# Patient Record
Sex: Female | Born: 1995 | Hispanic: No | Marital: Single | Smoking: Never smoker
Health system: Southern US, Community
[De-identification: ages and names within clinical notes are randomized; demographics above are authoritative.]

---

## 2015-10-21 ENCOUNTER — Encounter (HOSPITAL_COMMUNITY): Payer: Self-pay

## 2015-10-21 ENCOUNTER — Emergency Department (HOSPITAL_COMMUNITY)
Admission: EM | Admit: 2015-10-21 | Discharge: 2015-10-22 | Disposition: A | Discharge status: Home / Self Care | Payer: PRIVATE HEALTH INSURANCE | Attending: Emergency Medicine | Admitting: Emergency Medicine

## 2015-10-21 DIAGNOSIS — K529 Noninfective gastroenteritis and colitis, unspecified: Secondary | ICD-10-CM

## 2015-10-21 DIAGNOSIS — E860 Dehydration: Secondary | ICD-10-CM

## 2015-10-21 DIAGNOSIS — R05 Cough: Secondary | ICD-10-CM | POA: Diagnosis not present

## 2015-10-21 DIAGNOSIS — R112 Nausea with vomiting, unspecified: Secondary | ICD-10-CM | POA: Diagnosis present

## 2015-10-21 DIAGNOSIS — Z3202 Encounter for pregnancy test, result negative: Secondary | ICD-10-CM | POA: Insufficient documentation

## 2015-10-21 DIAGNOSIS — R000 Tachycardia, unspecified: Secondary | ICD-10-CM | POA: Diagnosis not present

## 2015-10-21 DIAGNOSIS — R0981 Nasal congestion: Secondary | ICD-10-CM | POA: Insufficient documentation

## 2015-10-21 LAB — CBC
HCT: 45 % (ref 36.0–46.0)
HEMOGLOBIN: 15 g/dL (ref 12.0–15.0)
MCH: 28.4 pg (ref 26.0–34.0)
MCHC: 33.3 g/dL (ref 30.0–36.0)
MCV: 85.1 fL (ref 78.0–100.0)
PLATELETS: 251 10*3/uL (ref 150–400)
RBC: 5.29 MIL/uL — AB (ref 3.87–5.11)
RDW: 13.2 % (ref 11.5–15.5)
WBC: 14.5 10*3/uL — AB (ref 4.0–10.5)

## 2015-10-21 LAB — POC URINE PREG, ED: PREG TEST UR: NEGATIVE

## 2015-10-21 NOTE — ED Provider Notes (Signed)
CSN: [id number]     Arrival date & time 10/21/15  2256 History  By signing my name below, I, Doreatha MartinEva Mathews, attest that this documentation has been prepared under the direction and in the presence of Derwood KaplanAnkit Hadlei Stitt, MD. Electronically Signed: Doreatha MartinEva Mathews, ED Scribe. 10/22/2015. 12:29 AM.    Chief Complaint  Patient presents with  . Emesis   The history is provided by the patient. No language interpreter was used.    HPI Comments: [place] CalixChristine Krager is a 19 y.o. female who presents to the Emergency Department complaining of moderate, intermittent, sudden onset emesis (10x, last episode one hour ago) onset at 1900 this evening. She states associated nausea, abdominal pain, loose and watery diarrhea (10x), productive cough with blood streaking in the phlegm, nasal congestion, transient dizziness and lightheadedness (resolved). She reports that her emesis began before her abdominal pain. She also notes some abdominal distension before the emesis that has resolved. No blood or mucous noted in her emesis or diarrhea. She states that her emesis had stomach content initially followed by yellow and green content. Pt reports relief of cough with Nyquil. No family members with similar symptoms. No suspicious food intake. Otherwise healthy. No h/o abdominal surgery. She denies fever, myalgias, sore throat, wheezing.   History reviewed. No pertinent past medical history. History reviewed. No pertinent past surgical history. No family history on file. Social History  Substance Use Topics  . Smoking status: Never Smoker   . Smokeless tobacco: None  . Alcohol Use: Yes   OB History    No data available     Review of Systems  Constitutional: Negative for fever.  HENT: Positive for congestion. Negative for rhinorrhea and sore throat.   Respiratory: Positive for cough. Negative for wheezing.   Gastrointestinal: Positive for nausea, vomiting, abdominal pain and diarrhea. Negative for blood in stool.   Musculoskeletal: Negative for myalgias.  Neurological: Negative for dizziness and light-headedness.   A complete 10 system review of systems was obtained and all systems are negative except as noted in the HPI and PMH.   Allergies  Review of patient's allergies indicates no known allergies.  Home Medications   Prior to Admission medications   Medication Sig Start Date End Date Taking? Authorizing Provider  benzonatate (TESSALON) 100 MG capsule Take 1 capsule (100 mg total) by mouth every 8 (eight) hours. 10/22/15   Derwood KaplanAnkit Maximillian Habibi, MD  ondansetron (ZOFRAN ODT) 8 MG disintegrating tablet Take 1 tablet (8 mg total) by mouth every 8 (eight) hours as needed for nausea. 10/22/15   Avea Mcgowen Rhunette CroftNanavati, MD   BP 92/64 mmHg  Pulse 100  Temp(Src) 97.9 F (36.6 C) (Oral)  Resp 18  Ht 5' (1.524 m)  Wt 106 lb (48.081 kg)  BMI 20.70 kg/m2  SpO2 99%  LMP 09/16/2015 Physical Exam  Constitutional: She is oriented to person, place, and time. She appears well-developed and well-nourished.  HENT:  Head: Normocephalic and atraumatic.  Mouth/Throat: Oropharynx is clear and moist. No oropharyngeal exudate.  Eyes: Conjunctivae and EOM are normal. Pupils are equal, round, and reactive to light.  Neck: Normal range of motion. Neck supple.  Cardiovascular: Tachycardia present.   No murmur heard. Pulmonary/Chest: Effort normal. No respiratory distress.  Lungs CTA bilaterally.   Abdominal: Soft. She exhibits no distension. There is no tenderness.  Musculoskeletal: Normal range of motion.  Lymphadenopathy:    She has no cervical adenopathy.  Neurological: She is alert and oriented to person, place, and time.  Skin: Skin is warm  and dry.  Psychiatric: She has a normal mood and affect. Her behavior is normal.  Nursing note and vitals reviewed.  ED Course  Procedures (including critical care time) DIAGNOSTIC STUDIES: Oxygen Saturation is 97% on RA, normal by my interpretation.    COORDINATION OF  CARE: 12:26 AM Discussed treatment plan with pt at bedside and pt agreed to plan.  1:40 AM Pt reassessed; reports she still feels nauseated. She states she has not had any emesis since examination. Discussed strict return precautions. Will discharge with nausea and cough medication.    Labs Review Labs Reviewed  COMPREHENSIVE METABOLIC PANEL - Abnormal; Notable for the following:    Glucose, Bld 113 (*)    All other components within normal limits  CBC - Abnormal; Notable for the following:    WBC 14.5 (*)    RBC 5.29 (*)    All other components within normal limits  URINALYSIS, ROUTINE W REFLEX MICROSCOPIC (NOT AT [facility]) - Abnormal; Notable for the following:    APPearance CLOUDY (*)    Bilirubin Urine SMALL (*)    Ketones, ur >80 (*)    Protein, ur 30 (*)    Leukocytes, UA SMALL (*)    All other components within normal limits  URINE MICROSCOPIC-ADD ON - Abnormal; Notable for the following:    Squamous Epithelial / LPF 6-30 (*)    Bacteria, UA RARE (*)    All other components within normal limits  LIPASE, BLOOD  POC URINE PREG, ED   I have personally reviewed and evaluated these lab results as part of my medical decision-making.  MDM   Final diagnoses:  Acute gastroenteritis  Dehydration    I personally performed the services described in this documentation, which was scribed in my presence. The recorded information has been reviewed and is accurate.   Pt comes in with c of n/v/diarrhea. She is tachycardic. Abs exam is normal. Pts symptoma have resolved since ER arrival. Labs show ketonuria. Likely mild dehydration. Will get po challenge initiated-  If she passes, we will d/c with return precautions.   Derwood Kaplan, MD 10/22/15 [phone]

## 2015-10-21 NOTE — ED Notes (Signed)
Pt here with c/o emesis x 10, diarrhea "a lot," onset 1900 tonight. Pt denies abdominal pain. Pt also reports productive cough.

## 2015-10-22 LAB — COMPREHENSIVE METABOLIC PANEL
ALK PHOS: 77 U/L (ref 38–126)
ALT: 20 U/L (ref 14–54)
ANION GAP: 11 (ref 5–15)
AST: 24 U/L (ref 15–41)
Albumin: 4.4 g/dL (ref 3.5–5.0)
BILIRUBIN TOTAL: 0.7 mg/dL (ref 0.3–1.2)
BUN: 14 mg/dL (ref 6–20)
CALCIUM: 9.1 mg/dL (ref 8.9–10.3)
CO2: 23 mmol/L (ref 22–32)
CREATININE: 0.74 mg/dL (ref 0.44–1.00)
Chloride: 102 mmol/L (ref 101–111)
Glucose, Bld: 113 mg/dL — ABNORMAL HIGH (ref 65–99)
Potassium: 4.2 mmol/L (ref 3.5–5.1)
Sodium: 136 mmol/L (ref 135–145)
TOTAL PROTEIN: 7.8 g/dL (ref 6.5–8.1)

## 2015-10-22 LAB — URINALYSIS, ROUTINE W REFLEX MICROSCOPIC
Glucose, UA: NEGATIVE mg/dL
Hgb urine dipstick: NEGATIVE
Ketones, ur: >80 mg/dL — AB
NITRITE: NEGATIVE
Protein, ur: 30 mg/dL — AB
SPECIFIC GRAVITY, URINE: 1.029 (ref 1.005–1.030)
pH: 7 (ref 5.0–8.0)

## 2015-10-22 LAB — LIPASE, BLOOD: Lipase: 28 U/L (ref 11–51)

## 2015-10-22 LAB — URINE MICROSCOPIC-ADD ON: RBC / HPF: NONE SEEN RBC/hpf (ref 0–5)

## 2015-10-22 MED ORDER — ONDANSETRON 4 MG PO TBDP
8.0000 mg | ORAL_TABLET | Freq: Once | ORAL | Status: AC
  Administered 2015-10-22: 8 mg via ORAL
  Filled 2015-10-22: qty 2

## 2015-10-22 MED ORDER — PROMETHAZINE HCL 25 MG PO TABS
25.0000 mg | ORAL_TABLET | Freq: Once | ORAL | Status: AC
  Administered 2015-10-22: 25 mg via ORAL
  Filled 2015-10-22: qty 1

## 2015-10-22 MED ORDER — BENZONATATE 100 MG PO CAPS: 100.0000 mg | ORAL_CAPSULE | Freq: Three times a day (TID) | ORAL | Status: AC

## 2015-10-22 MED ORDER — ONDANSETRON 8 MG PO TBDP: 8.0000 mg | ORAL_TABLET | Freq: Three times a day (TID) | ORAL | Status: AC | PRN

## 2015-10-22 MED ORDER — BENZONATATE 100 MG PO CAPS
100.0000 mg | ORAL_CAPSULE | Freq: Once | ORAL | Status: AC
  Administered 2015-10-22: 100 mg via ORAL
  Filled 2015-10-22: qty 1

## 2015-10-22 NOTE — Discharge Instructions (Signed)
We saw you in the ER for the vomiting and the loose bowel movement. All the results in the ER are normal -except slightly elevated white count (marker for stress) and sign of dehydration on the urine test. We think you might have mild stomach flu.  Please return to the ER if your symptoms worsen; you have increased pain, fevers, chills, inability to keep any medications down, bloody stools. Ensure you drink plenty of fluids.  Clear Liquid Diet A clear liquid diet is a short-term diet that is prescribed to provide the necessary fluid and basic energy you need when you can have nothing else. The clear liquid diet consists of liquids or solids that will become liquid at room temperature. You should be able to see through the liquid. There are many reasons that you may be restricted to clear liquids, such as:  When you have a sudden-onset (acute) condition that occurs before or after surgery.  To help your body slowly get adjusted to food again after a long period when you were unable to have food.  Replacement of fluids when you have a diarrheal disease.  When you are going to have certain exams, such as a colonoscopy, in which instruments are inserted inside your body to look at parts of your digestive system. WHAT CAN I HAVE? A clear liquid diet does not provide all the nutrients you need. It is important to choose a variety of the following items to get as many nutrients as possible:  Vegetable juices that do not have pulp.  Fruit juices and fruit drinks that do not have pulp.  Coffee (regular or decaffeinated), tea, or soda at the discretion of your health care provider.  Clear bouillon, broth, or strained broth-based soups.  High-protein and flavored gelatins.  Sugar or honey.  Ices or frozen ice pops that do not contain milk. If you are not sure whether you can have certain items, you should ask your health care provider. You may also ask your health care provider if there are any  other clear liquid options.   This information is not intended to replace advice given to you by your health care provider. Make sure you discuss any questions you have with your health care provider.   Document Released: 11/13/2005 Document Revised: 11/18/2013 Document Reviewed: 10/10/2013 Elsevier Interactive Patient Education 2016 [employer] Viral Gastroenteritis Viral gastroenteritis is also known as stomach flu. This condition affects the stomach and intestinal tract. It can cause sudden diarrhea and vomiting. The illness typically lasts 3 to 8 days. Most people develop an immune response that eventually gets rid of the virus. While this natural response develops, the virus can make you quite ill. CAUSES  Many different viruses can cause gastroenteritis, such as rotavirus or noroviruses. You can catch one of these viruses by consuming contaminated food or water. You may also catch a virus by sharing utensils or other personal items with an infected person or by touching a contaminated surface. SYMPTOMS  The most common symptoms are diarrhea and vomiting. These problems can cause a severe loss of body fluids (dehydration) and a body salt (electrolyte) imbalance. Other symptoms may include:  Fever.  Headache.  Fatigue.  Abdominal pain. DIAGNOSIS  Your caregiver can usually diagnose viral gastroenteritis based on your symptoms and a physical exam. A stool sample may also be taken to test for the presence of viruses or other infections. TREATMENT  This illness typically goes away on its own. Treatments are aimed at rehydration. The most  serious cases of viral gastroenteritis involve vomiting so severely that you are not able to keep fluids down. In these cases, fluids must be given through an intravenous line (IV). HOME CARE INSTRUCTIONS   Drink enough fluids to keep your urine clear or pale yellow. Drink small amounts of fluids frequently and increase the amounts as tolerated.  Ask  your caregiver for specific rehydration instructions.  Avoid:  Foods high in sugar.  Alcohol.  Carbonated drinks.  Tobacco.  Juice.  Caffeine drinks.  Extremely hot or cold fluids.  Fatty, greasy foods.  Too much intake of anything at one time.  Dairy products until 24 to 48 hours after diarrhea stops.  You may consume probiotics. Probiotics are active cultures of beneficial bacteria. They may lessen the amount and number of diarrheal stools in adults. Probiotics can be found in yogurt with active cultures and in supplements.  Wash your hands well to avoid spreading the virus.  Only take over-the-counter or prescription medicines for pain, discomfort, or fever as directed by your caregiver. Do not give aspirin to children. Antidiarrheal medicines are not recommended.  Ask your caregiver if you should continue to take your regular prescribed and over-the-counter medicines.  Keep all follow-up appointments as directed by your caregiver. SEEK IMMEDIATE MEDICAL CARE IF:   You are unable to keep fluids down.  You do not urinate at least once every 6 to 8 hours.  You develop shortness of breath.  You notice blood in your stool or vomit. This may look like coffee grounds.  You have abdominal pain that increases or is concentrated in one small area (localized).  You have persistent vomiting or diarrhea.  You have a fever.  The patient is a child younger than 3 months, and he or she has a fever.  The patient is a child older than 3 months, and he or she has a fever and persistent symptoms.  The patient is a child older than 3 months, and he or she has a fever and symptoms suddenly get worse.  The patient is a baby, and he or she has no tears when crying. MAKE SURE YOU:   Understand these instructions.  Will watch your condition.  Will get help right away if you are not doing well or get worse.   This information is not intended to replace advice given to you by  your health care provider. Make sure you discuss any questions you have with your health care provider.   Document Released: 11/13/2005 Document Revised: 02/05/2012 Document Reviewed: 08/30/2011 Elsevier Interactive Patient Education [employer]

## 2018-07-19 ENCOUNTER — Encounter (HOSPITAL_COMMUNITY): Payer: Self-pay

## 2018-07-19 ENCOUNTER — Emergency Department (HOSPITAL_COMMUNITY)
Admission: EM | Admit: 2018-07-19 | Discharge: 2018-07-19 | Disposition: A | Discharge status: Home / Self Care | Payer: No Typology Code available for payment source | Attending: Emergency Medicine | Admitting: Emergency Medicine

## 2018-07-19 ENCOUNTER — Emergency Department (HOSPITAL_COMMUNITY): Payer: No Typology Code available for payment source

## 2018-07-19 DIAGNOSIS — N83201 Unspecified ovarian cyst, right side: Secondary | ICD-10-CM | POA: Insufficient documentation

## 2018-07-19 DIAGNOSIS — R102 Pelvic and perineal pain: Secondary | ICD-10-CM | POA: Diagnosis present

## 2018-07-19 DIAGNOSIS — A749 Chlamydial infection, unspecified: Secondary | ICD-10-CM | POA: Diagnosis not present

## 2018-07-19 DIAGNOSIS — R079 Chest pain, unspecified: Secondary | ICD-10-CM | POA: Insufficient documentation

## 2018-07-19 LAB — CBC WITH DIFFERENTIAL/PLATELET
BASOS PCT: 0 %
Basophils Absolute: 0 10*3/uL (ref 0.0–0.1)
EOS ABS: 0.1 10*3/uL (ref 0.0–0.7)
Eosinophils Relative: 1 %
HEMATOCRIT: 44 % (ref 36.0–46.0)
HEMOGLOBIN: 13.8 g/dL (ref 12.0–15.0)
LYMPHS PCT: 42 %
Lymphs Abs: 2.8 10*3/uL (ref 0.7–4.0)
MCH: 27.8 pg (ref 26.0–34.0)
MCHC: 31.4 g/dL (ref 30.0–36.0)
MCV: 88.7 fL (ref 78.0–100.0)
Monocytes Absolute: 0.3 10*3/uL (ref 0.1–1.0)
Monocytes Relative: 5 %
NEUTROS ABS: 3.5 10*3/uL (ref 1.7–7.7)
NEUTROS PCT: 52 %
Platelets: 212 10*3/uL (ref 150–400)
RBC: 4.96 MIL/uL (ref 3.87–5.11)
RDW: 12.5 % (ref 11.5–15.5)
WBC: 6.7 10*3/uL (ref 4.0–10.5)

## 2018-07-19 LAB — COMPREHENSIVE METABOLIC PANEL
ALK PHOS: 83 U/L (ref 38–126)
ALT: 16 U/L (ref 0–44)
ANION GAP: 7 (ref 5–15)
AST: 29 U/L (ref 15–41)
Albumin: 4.3 g/dL (ref 3.5–5.0)
BILIRUBIN TOTAL: 0.9 mg/dL (ref 0.3–1.2)
BUN: 7 mg/dL (ref 6–20)
CALCIUM: 9.2 mg/dL (ref 8.9–10.3)
CO2: 26 mmol/L (ref 22–32)
CREATININE: 0.77 mg/dL (ref 0.44–1.00)
Chloride: 107 mmol/L (ref 98–111)
Glucose, Bld: 93 mg/dL (ref 70–99)
Potassium: 4.7 mmol/L (ref 3.5–5.1)
SODIUM: 140 mmol/L (ref 135–145)
Total Protein: 6.6 g/dL (ref 6.5–8.1)

## 2018-07-19 LAB — URINALYSIS, ROUTINE W REFLEX MICROSCOPIC
Bilirubin Urine: NEGATIVE
Glucose, UA: NEGATIVE mg/dL
HGB URINE DIPSTICK: NEGATIVE
KETONES UR: NEGATIVE mg/dL
Leukocytes, UA: NEGATIVE
NITRITE: NEGATIVE
Protein, ur: NEGATIVE mg/dL
SPECIFIC GRAVITY, URINE: 1.016 (ref 1.005–1.030)
pH: 5 (ref 5.0–8.0)

## 2018-07-19 LAB — LIPASE, BLOOD: Lipase: 40 U/L (ref 11–51)

## 2018-07-19 LAB — I-STAT BETA HCG BLOOD, ED (MC, WL, AP ONLY): I-stat hCG, quantitative: <5 m[IU]/mL (ref <5)

## 2018-07-19 LAB — WET PREP, GENITAL
SPERM: NONE SEEN
Trich, Wet Prep: NONE SEEN
YEAST WET PREP: NONE SEEN

## 2018-07-19 LAB — RPR: RPR: NONREACTIVE

## 2018-07-19 LAB — HIV ANTIBODY (ROUTINE TESTING W REFLEX): HIV Screen 4th Generation wRfx: NONREACTIVE

## 2018-07-19 LAB — PREGNANCY, URINE: Preg Test, Ur: NEGATIVE

## 2018-07-19 MED ORDER — CEFTRIAXONE SODIUM 250 MG IJ SOLR
250.0000 mg | Freq: Once | INTRAMUSCULAR | Status: AC
  Administered 2018-07-19: 250 mg via INTRAMUSCULAR
  Filled 2018-07-19: qty 250

## 2018-07-19 MED ORDER — LIDOCAINE HCL (PF) 1 % IJ SOLN
INTRAMUSCULAR | Status: AC
  Filled 2018-07-19: qty 5

## 2018-07-19 MED ORDER — IBUPROFEN 600 MG PO TABS: 600.0000 mg | ORAL_TABLET | Freq: Four times a day (QID) | ORAL | 0 refills | Status: AC | PRN

## 2018-07-19 MED ORDER — AZITHROMYCIN 250 MG PO TABS
1000.0000 mg | ORAL_TABLET | Freq: Once | ORAL | Status: AC
  Administered 2018-07-19: 1000 mg via ORAL
  Filled 2018-07-19: qty 4

## 2018-07-19 NOTE — ED Provider Notes (Signed)
[facility] EMERGENCY DEPARTMENT Provider Note   CSN: [id number] Arrival date & time: 07/19/18  4098     History   Chief Complaint No chief complaint on file.   HPI Stacie Welch is a 22 y.o. female.  The history is provided by the patient and a parent. No language interpreter was used.    22 year old female presenting for evaluation of abdominal pain.  Patient report for the past week and a half, she has had recurrent lower abdominal pain.  She described pain as a uncomfortable achy sensation going across her abdomen with associated nausea, and having loose stools.  She also endorsed occasional bouts of nonbloody nonbilious vomiting.  When the pain is intense, she also endorsed pain in her chest.  Pain is waxing and waning, sometimes worsening with movement.  She does endorse having significant amount of stress.  She denies any recent travel or eating exotic food.  No one around her is sick.  States she is currently not sexually active.  Denies any vaginal bleeding or vaginal discharge.  Last menstrual.  Was a month ago.  Currently rates pain as 4 out of 10.  States she was seen at an urgent care center 3 days ago for her symptoms.  Blood work at that time was unremarkable but symptoms still persist.  History reviewed. No pertinent past medical history.  There are no active problems to display for this patient.   History reviewed. No pertinent surgical history.   OB History   None      Home Medications    Prior to Admission medications   Medication Sig Start Date End Date Taking? Authorizing Provider  benzonatate (TESSALON) 100 MG capsule Take 1 capsule (100 mg total) by mouth every 8 (eight) hours. 10/22/15   Derwood Kaplan, MD  ondansetron (ZOFRAN ODT) 8 MG disintegrating tablet Take 1 tablet (8 mg total) by mouth every 8 (eight) hours as needed for nausea. 10/22/15   Derwood Kaplan, MD    Family History No family history on file.  Social  History Social History   Tobacco Use  . Smoking status: Never Smoker  Substance Use Topics  . Alcohol use: Yes  . Drug use: Not on file     Allergies   Patient has no known allergies.   Review of Systems Review of Systems  All other systems reviewed and are negative.    Physical Exam Updated Vital Signs BP 113/83 (BP Location: Right Arm)   Pulse 75   Temp 98 F (36.7 C) (Oral)   Resp 16   SpO2 100%   Physical Exam  Constitutional: She appears well-developed and well-nourished. No distress.  Well-appearing female in no acute discomfort.  HENT:  Head: Atraumatic.  Eyes: Conjunctivae are normal.  Neck: Neck supple.  Cardiovascular: Normal rate and regular rhythm.  Pulmonary/Chest: Effort normal and breath sounds normal.  Abdominal: Soft. Bowel sounds are normal. She exhibits no distension. There is tenderness (Tenderness along lower abdomen without focal point tenderness.  Negative psoas or obturator.).  Genitourinary:  Genitourinary Comments: Pelvic exam: RN in room as chaperone, external female genitalia normal with no signs of lesions or injuries. Speculum exam shows normal cervix with no obvious discharge. Bimanual exam with no adnexal tenderness,but mild cervical motion tenderness, uterus normal size and nontender, no masses appreciated. The external cervical os is closed.   Neurological: She is alert.  Skin: No rash noted.  Psychiatric: She has a normal mood and affect.  Nursing note and  vitals reviewed.    ED Treatments / Results  Labs (all labs ordered are listed, but only abnormal results are displayed) Labs Reviewed  WET PREP, GENITAL - Abnormal; Notable for the following components:      Result Value   Clue Cells Wet Prep HPF POC PRESENT (*)    WBC, Wet Prep HPF POC MANY (*)    All other components within normal limits  CBC WITH DIFFERENTIAL/PLATELET  COMPREHENSIVE METABOLIC PANEL  LIPASE, BLOOD  URINALYSIS, ROUTINE W REFLEX MICROSCOPIC   PREGNANCY, URINE  RPR  HIV ANTIBODY (ROUTINE TESTING)  I-STAT BETA HCG BLOOD, ED ([facility], [facility], [facility] ONLY)  POC URINE PREG, ED  GC/CHLAMYDIA PROBE AMP ([place]) NOT AT [facility]    EKG None   Date: 07/19/2018  Rate: 93  Rhythm: normal sinus rhythm  QRS Axis: normal  Intervals: normal  ST/T Wave abnormalities: normal  Conduction Disutrbances: none  Narrative Interpretation:   Old EKG Reviewed: No significant changes noted     Radiology US Transvaginal Non-ob  Result Date: 07/19/2018 CLINICAL DATA:  Patient with right lower quadrant abdominal pain for 3 days. EXAM: TRANSABDOMINAL AND TRANSVAGINAL ULTRASOUND OF PELVIS DOPPLER ULTRASOUND OF OVARIES TECHNIQUE: Both transabdominal and transvaginal ultrasound examinations of the pelvis were performed. Transabdominal technique was performed for global imaging of the pelvis including uterus, ovaries, adnexal regions, and pelvic cul-de-sac. It was necessary to proceed with endovaginal exam following the transabdominal exam to visualize the adnexal structures. Color and duplex Doppler ultrasound was utilized to evaluate blood flow to the ovaries. COMPARISON:  None. FINDINGS: Uterus Measurements: 7.4 x 3.0 x 4.6 cm. No fibroids or other mass visualized. Endometrium Thickness: 5 mm.  No focal abnormality visualized. Right ovary Measurements: 3.3 x 2.6 x 3.5 cm. Normal appearance/no adnexal mass. There is a 2.0 cm cyst within the right ovary. Left ovary Measurements: 3.8 x 1.9 x 2.0 cm. Normal appearance/no adnexal mass. Pulsed Doppler evaluation of both ovaries demonstrates normal low-resistance arterial and venous waveforms. Other findings Trace pelvic fluid. IMPRESSION: No acute process within the pelvis. No sonographic evidence to suggest torsion. There is a 2 cm cyst within the right ovary. Electronically Signed   By: Annia Belt M.D.   On: 07/19/2018 12:08   US Pelvis Complete  Result Date: 07/19/2018 CLINICAL DATA:  Patient with right lower quadrant  abdominal pain for 3 days. EXAM: TRANSABDOMINAL AND TRANSVAGINAL ULTRASOUND OF PELVIS DOPPLER ULTRASOUND OF OVARIES TECHNIQUE: Both transabdominal and transvaginal ultrasound examinations of the pelvis were performed. Transabdominal technique was performed for global imaging of the pelvis including uterus, ovaries, adnexal regions, and pelvic cul-de-sac. It was necessary to proceed with endovaginal exam following the transabdominal exam to visualize the adnexal structures. Color and duplex Doppler ultrasound was utilized to evaluate blood flow to the ovaries. COMPARISON:  None. FINDINGS: Uterus Measurements: 7.4 x 3.0 x 4.6 cm. No fibroids or other mass visualized. Endometrium Thickness: 5 mm.  No focal abnormality visualized. Right ovary Measurements: 3.3 x 2.6 x 3.5 cm. Normal appearance/no adnexal mass. There is a 2.0 cm cyst within the right ovary. Left ovary Measurements: 3.8 x 1.9 x 2.0 cm. Normal appearance/no adnexal mass. Pulsed Doppler evaluation of both ovaries demonstrates normal low-resistance arterial and venous waveforms. Other findings Trace pelvic fluid. IMPRESSION: No acute process within the pelvis. No sonographic evidence to suggest torsion. There is a 2 cm cyst within the right ovary. Electronically Signed   By: Annia Belt M.D.   On: 07/19/2018 12:08   US Pelvic  Doppler (torsion R/o Or Mass Arterial Flow)  Result Date: 07/19/2018 CLINICAL DATA:  Patient with right lower quadrant abdominal pain for 3 days. EXAM: TRANSABDOMINAL AND TRANSVAGINAL ULTRASOUND OF PELVIS DOPPLER ULTRASOUND OF OVARIES TECHNIQUE: Both transabdominal and transvaginal ultrasound examinations of the pelvis were performed. Transabdominal technique was performed for global imaging of the pelvis including uterus, ovaries, adnexal regions, and pelvic cul-de-sac. It was necessary to proceed with endovaginal exam following the transabdominal exam to visualize the adnexal structures. Color and duplex Doppler ultrasound was  utilized to evaluate blood flow to the ovaries. COMPARISON:  None. FINDINGS: Uterus Measurements: 7.4 x 3.0 x 4.6 cm. No fibroids or other mass visualized. Endometrium Thickness: 5 mm.  No focal abnormality visualized. Right ovary Measurements: 3.3 x 2.6 x 3.5 cm. Normal appearance/no adnexal mass. There is a 2.0 cm cyst within the right ovary. Left ovary Measurements: 3.8 x 1.9 x 2.0 cm. Normal appearance/no adnexal mass. Pulsed Doppler evaluation of both ovaries demonstrates normal low-resistance arterial and venous waveforms. Other findings Trace pelvic fluid. IMPRESSION: No acute process within the pelvis. No sonographic evidence to suggest torsion. There is a 2 cm cyst within the right ovary. Electronically Signed   By: Annia Belt M.D.   On: 07/19/2018 12:08    Procedures Pelvic exam Date/Time: 07/19/2018 9:10 AM Performed by: Fayrene Helper, PA-C Authorized by: Fayrene Helper, PA-C  Consent: Verbal consent obtained. Consent given by: patient Patient understanding: patient states understanding of the procedure being performed Patient identity confirmed: verbally with patient Patient tolerance: Patient tolerated the procedure well with no immediate complications    (including critical care time)  Medications Ordered in ED Medications  lidocaine (PF) (XYLOCAINE) 1 % injection (has no administration in time range)  cefTRIAXone (ROCEPHIN) injection 250 mg (250 mg Intramuscular Given 07/19/18 1053)  azithromycin (ZITHROMAX) tablet 1,000 mg (1,000 mg Oral Given 07/19/18 1053)     Initial Impression / Assessment and Plan / ED Course  I have reviewed the triage vital signs and the nursing notes.  Pertinent labs & imaging results that were available during my care of the patient were reviewed by me and considered in my medical decision making (see chart for details).     BP 113/83 (BP Location: Right Arm)   Pulse 75   Temp 98 F (36.7 C) (Oral)   Resp 16   SpO2 100%    Final Clinical  Impressions(s) / ED Diagnoses   Final diagnoses:  Pelvic pain  Cyst of right ovary    ED Discharge Orders         Ordered    ibuprofen (ADVIL,MOTRIN) 600 MG tablet  Every 6 hours PRN     07/19/18 1248         8:41 AM Patient here with lower abdominal pain, nausea, vomiting, diarrhea, chest pain brought on by stress or with movement.  Symptoms been ongoing for more than a week.  She does have some tenderness to her lower abdomen but she is otherwise well-appearing.  Work-up initiated.  10:40 AM Pt did have mild cervical motion tenderness on exam. No adnexal tenderness.  Labs remarkable for presence of clue cells and many WBC.  I discussed this with pt, she is in agreement with receiving prophylactic abx (zithromax, rocephin).  Normal WBC, low suspicion for appendicitis given the duration of her sxs.  May consider pelvis US as pt concern for ovarian cyst.   12:47 PM Pelvic ultrasound shown no acute process within the pelvis.  No evidence to  suggest torsion.  There is a 2 cm ovarian cyst on the right side.  I discussed this finding with patient.  I recommend Tylenol ibuprofen as needed for pain.  I have low suspicion for appendicitis.  She is stable for discharge.  Return precautions discussed.  Since patient did not complain of any vaginal discharge and no foul odor, even in the presence of clue cells, I have low suspicion for BV causing her symptoms.   Fayrene Helperran, Lorelai Huyser, PA-C 07/19/18 1249    Sabas SousBero, Michael M, MD 07/19/18 [phone]

## 2018-07-19 NOTE — ED Triage Notes (Signed)
Patient complains of 3 days of lower abdominal pain with CP and reports SOB with eating. [facility] and had labs collected with no abnormality. Patient alert and oriented, NAD

## 2018-07-19 NOTE — ED Notes (Signed)
Pelvic done with speculum and bimanual, spec to lab 

## 2018-07-22 LAB — GC/CHLAMYDIA PROBE AMP (~~LOC~~) NOT AT ARMC
Chlamydia: POSITIVE — AB
NEISSERIA GONORRHEA: NEGATIVE

## 2018-07-24 ENCOUNTER — Ambulatory Visit: Payer: PRIVATE HEALTH INSURANCE | Admitting: Family Medicine

## 2019-12-20 IMAGING — US US ART/VEN ABD/PELV/SCROTUM DOPPLER LTD
1 series · 13 of 25 positions shown · non-contrast
Comparison: None.

CLINICAL DATA: Patient with right lower quadrant abdominal pain for
3 days.

EXAM:
TRANSABDOMINAL AND TRANSVAGINAL ULTRASOUND OF PELVIS
DOPPLER ULTRASOUND OF OVARIES
TECHNIQUE: Both transabdominal and transvaginal ultrasound examinations of the
pelvis were performed. Transabdominal technique was performed for
global imaging of the pelvis including uterus, ovaries, adnexal
regions, and pelvic cul-de-sac.
It was necessary to proceed with endovaginal exam following the
transabdominal exam to visualize the adnexal structures. Color and
duplex Doppler ultrasound was utilized to evaluate blood flow to the
ovaries.

[Series 1: us art/ven abd/pelv/scrotum doppler ltd · 0.20mm/px · 13 of 96 slices shown]
[im 1/96]
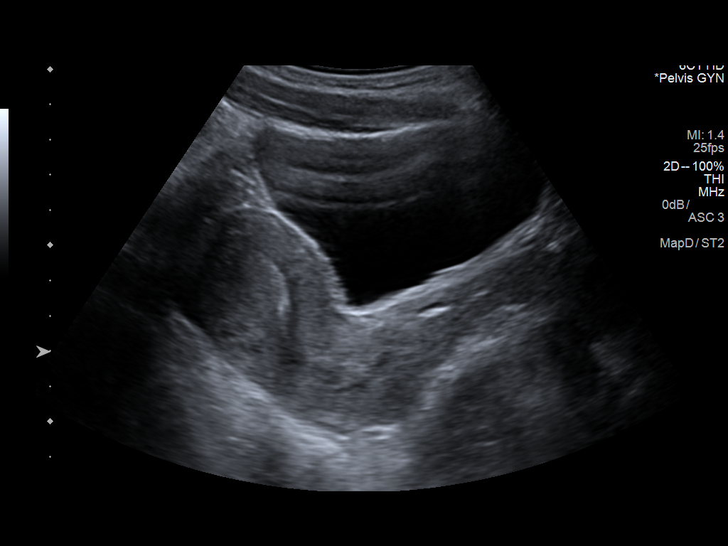
[im 8/96]
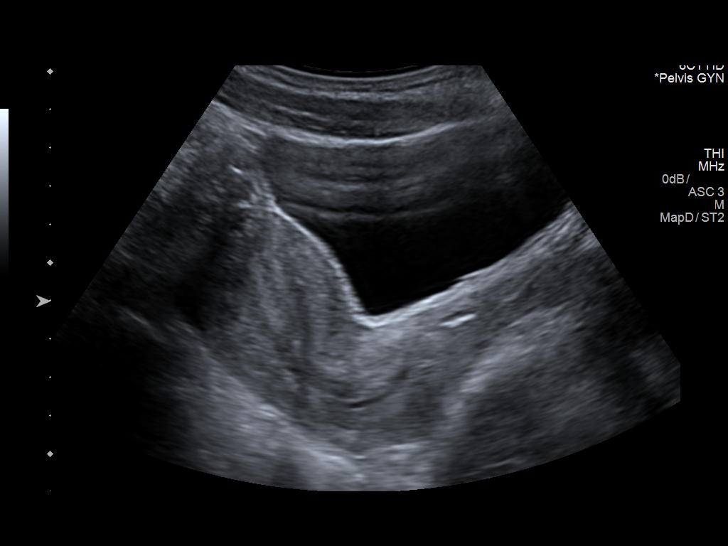
[im 16/96]
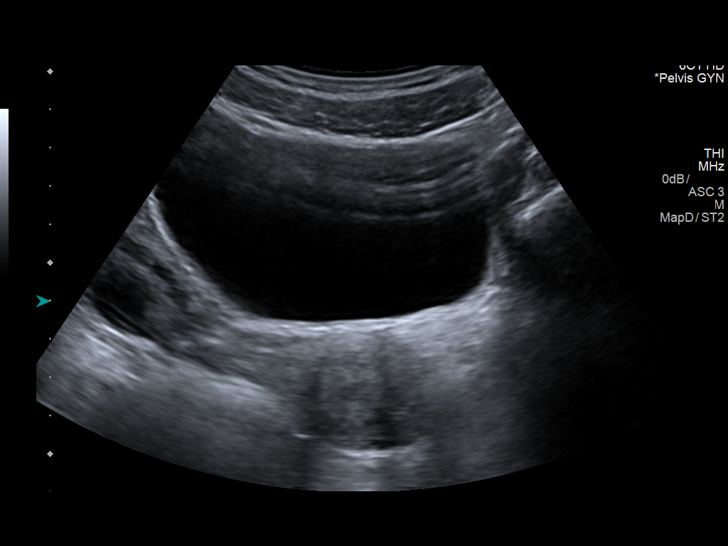
[im 24/96]
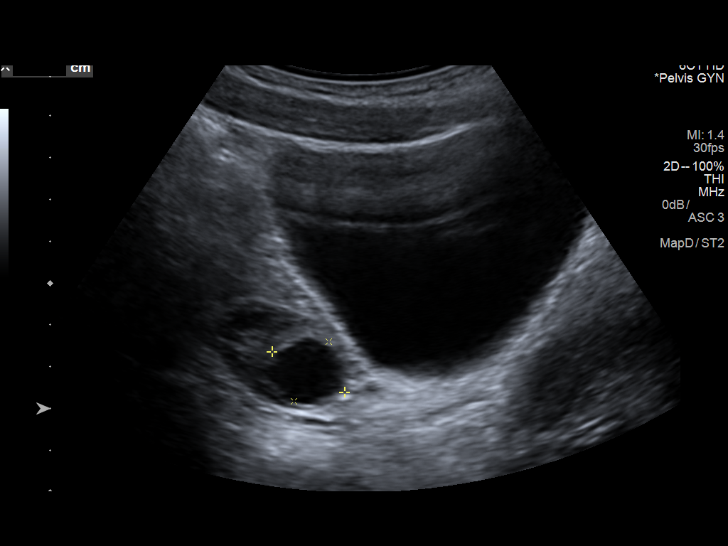
[im 32/96]
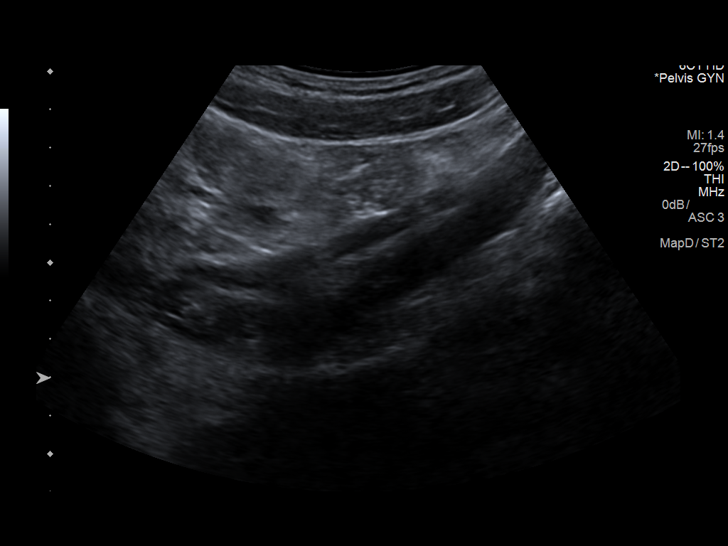
[im 40/96]
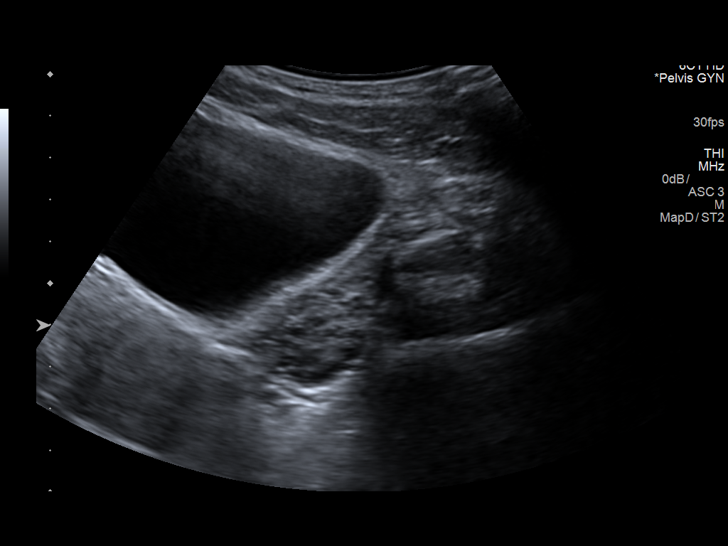
[im 48/96]
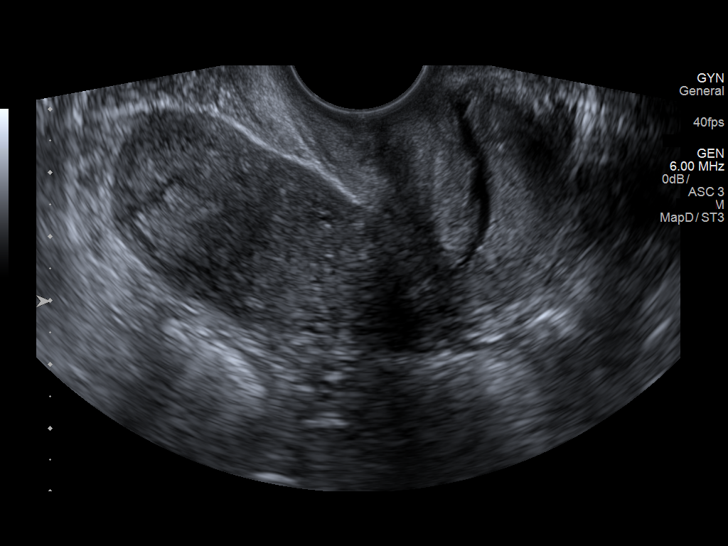
[im 56/96]
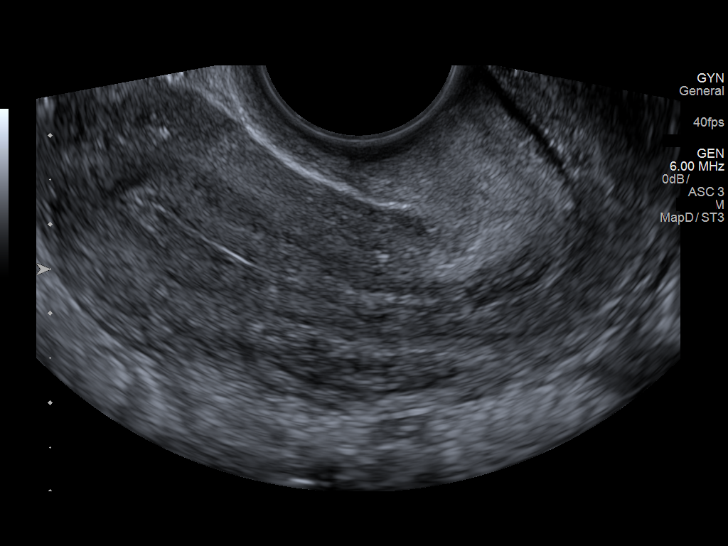
[im 64/96]
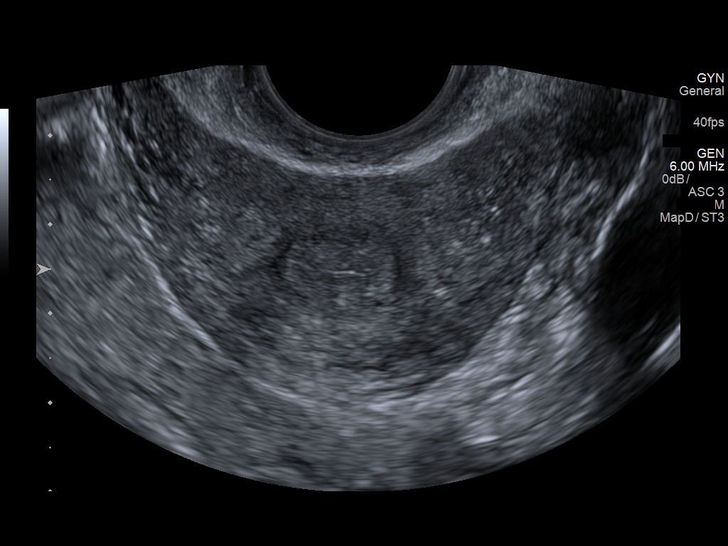
[im 72/96]
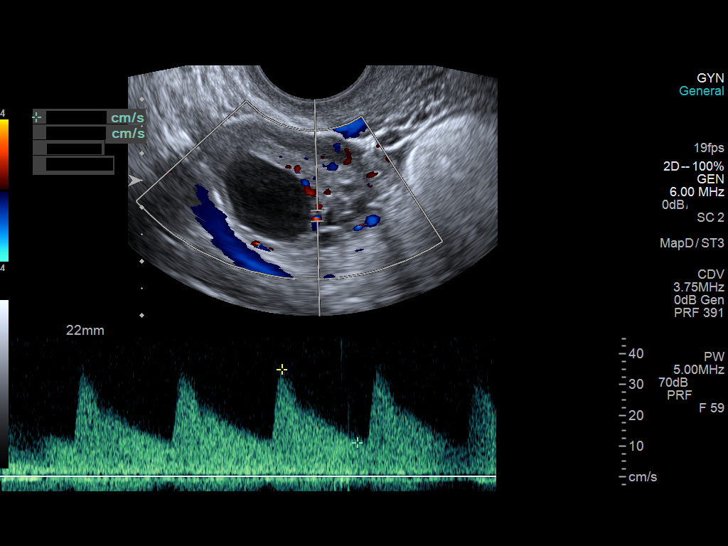
[im 80/96]
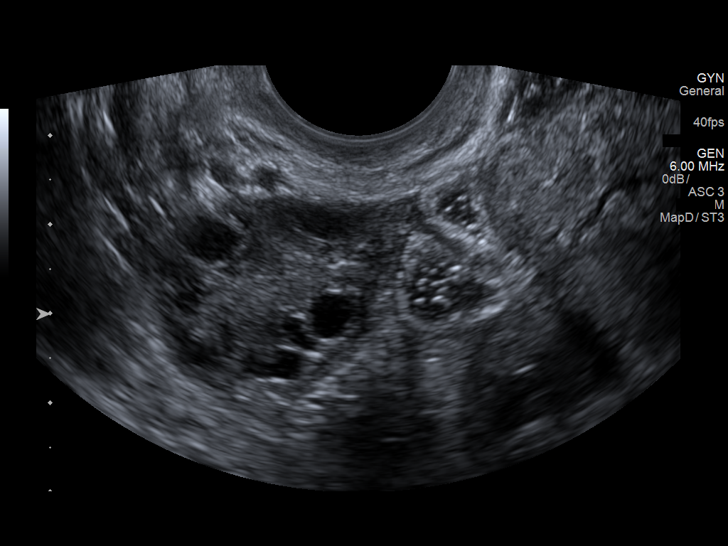
[im 88/96]
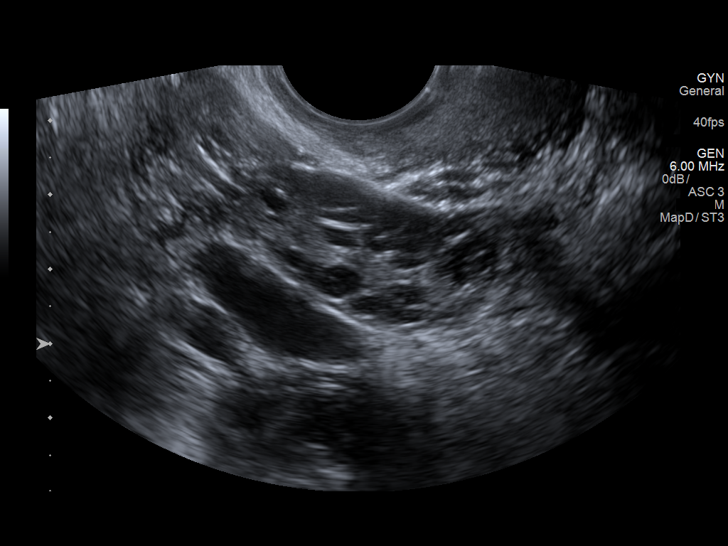
[im 96/96]
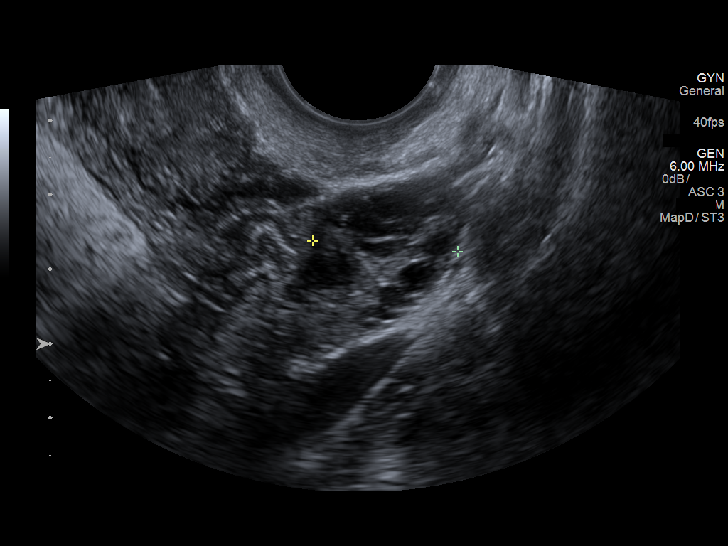

[13 of 25 positions shown; findings below may reference images not displayed]

FINDINGS: Uterus

Measurements: 7.4 x 3.0 x 4.6 cm. No fibroids or other mass
visualized.

Endometrium

Thickness: 5 mm.  No focal abnormality visualized.

Right ovary

Measurements: 3.3 x 2.6 x 3.5 cm. Normal appearance/no adnexal mass.
There is a 2.0 cm cyst within the right ovary.

Left ovary

Measurements: 3.8 x 1.9 x 2.0 cm. Normal appearance/no adnexal mass.

Pulsed Doppler evaluation of both ovaries demonstrates normal
low-resistance arterial and venous waveforms.

Other findings

Trace pelvic fluid.
IMPRESSION: No acute process within the pelvis. No sonographic evidence to
suggest torsion. There is a 2 cm cyst within the right ovary.
